# Patient Record
Sex: Female | Born: 1945 | Race: White | Hispanic: No
Health system: Southern US, Community
[De-identification: ages and names within clinical notes are randomized; demographics above are authoritative.]

---

## 2012-10-16 ENCOUNTER — Emergency Department: Payer: Self-pay | Admitting: Unknown Physician Specialty

## 2012-10-16 LAB — URINALYSIS, COMPLETE
Bacteria: NONE SEEN
Bilirubin,UR: NEGATIVE
Glucose,UR: NEGATIVE mg/dL (ref 0–75)
Leukocyte Esterase: NEGATIVE
Nitrite: NEGATIVE
Protein: NEGATIVE
RBC,UR: 4 /HPF (ref 0–5)
Specific Gravity: 1.031 (ref 1.003–1.030)
WBC UR: 1 /HPF (ref 0–5)

## 2012-10-16 LAB — CBC
HCT: 32.9 % — ABNORMAL LOW (ref 35.0–47.0)
HGB: 10.7 g/dL — ABNORMAL LOW (ref 12.0–16.0)
MCHC: 32.5 g/dL (ref 32.0–36.0)
RBC: 3.82 10*6/uL (ref 3.80–5.20)
WBC: 12.3 10*3/uL — ABNORMAL HIGH (ref 3.6–11.0)

## 2012-10-16 LAB — COMPREHENSIVE METABOLIC PANEL
Anion Gap: 13 (ref 7–16)
BUN: 10 mg/dL (ref 7–18)
Calcium, Total: 9 mg/dL (ref 8.5–10.1)
Chloride: 98 mmol/L (ref 98–107)
Co2: 24 mmol/L (ref 21–32)
EGFR (African American): >60
EGFR (Non-African Amer.): >60
Potassium: 2.7 mmol/L — ABNORMAL LOW (ref 3.5–5.1)
SGOT(AST): 37 U/L (ref 15–37)
SGPT (ALT): 19 U/L (ref 12–78)
Total Protein: 6.8 g/dL (ref 6.4–8.2)

## 2012-10-16 LAB — LIPASE, BLOOD: Lipase: 104 U/L (ref 73–393)

## 2012-10-19 LAB — URINE CULTURE

## 2012-10-21 DEATH — deceased

## 2012-10-22 LAB — CULTURE, BLOOD (SINGLE)

## 2014-03-13 IMAGING — CT CT ABD-PELV W/ CM
1 of 3 series · 13 of 32 positions shown, 18 images · non-contrast
Comparison: none

REASON FOR EXAM: (1) abd pain and diarrhea h/o metastatic CA eval; (2)
abd pain and diarrhea h/o
COMMENTS:

PROCEDURE:     CT  - CT ABDOMEN / PELVIS  W  - October 16, 2012  [DATE]
RESULT:     CT abdomen and pelvis dated 10/16/2012.
TECHNIQUE: Helical 3 mm sections were obtained from the lung bases through
the pubic symphysis status post intravenous administration of 85 mm
[id number].

[Series 2: 3mm soft tissue · axial · 0.75mm/px · z∈[-428,-41]mm · 13 of 147 slices shown, 18 images]
[im 9/147  soft-tissue]
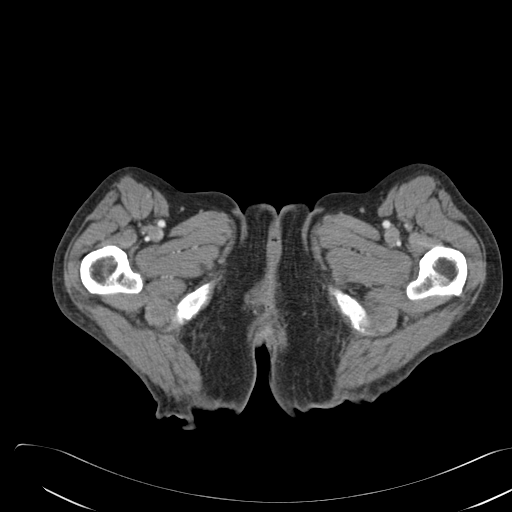
[im 9/147  bone]
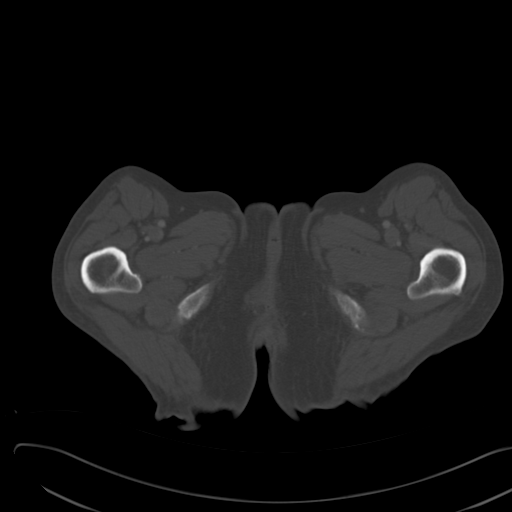
[im 26/147  soft-tissue]
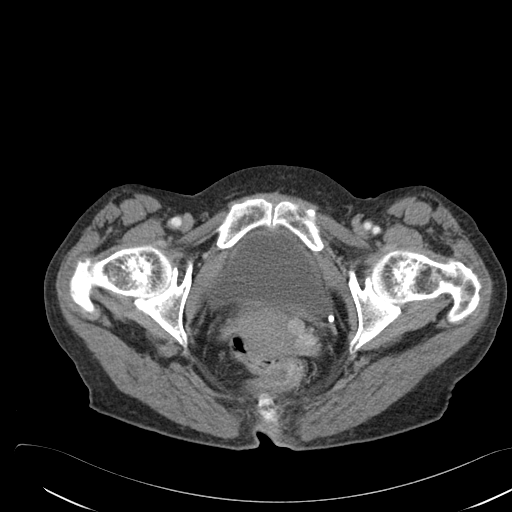
[im 35/147  soft-tissue]
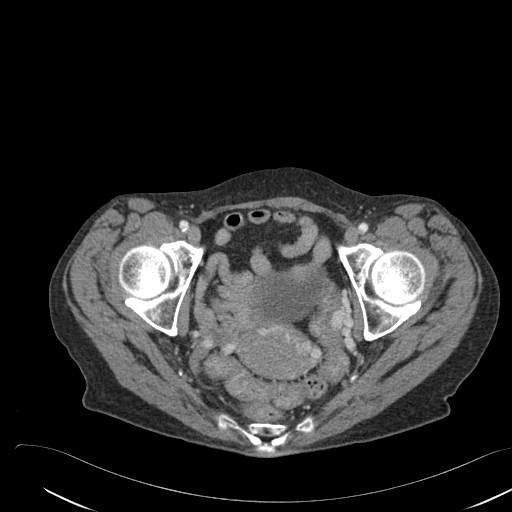
[im 43/147  soft-tissue]
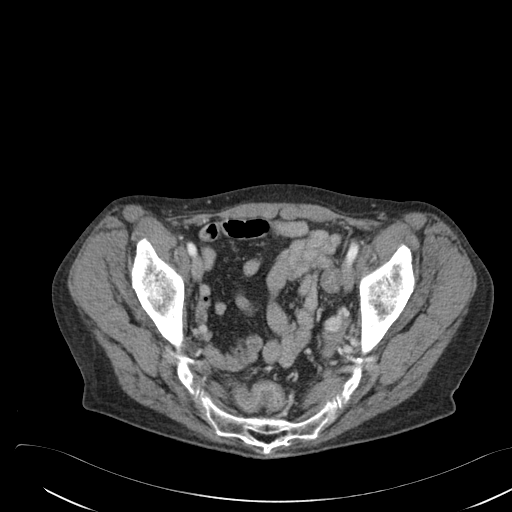
[im 61/147  soft-tissue]
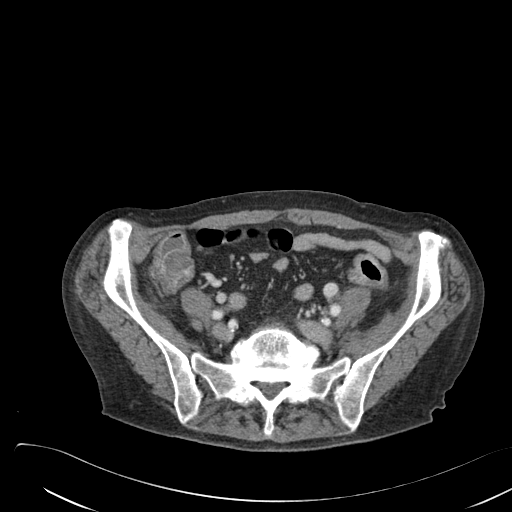
[im 69/147  soft-tissue]
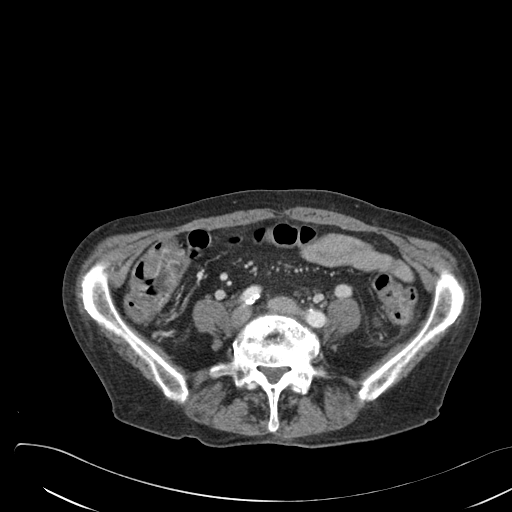
[im 78/147  soft-tissue]
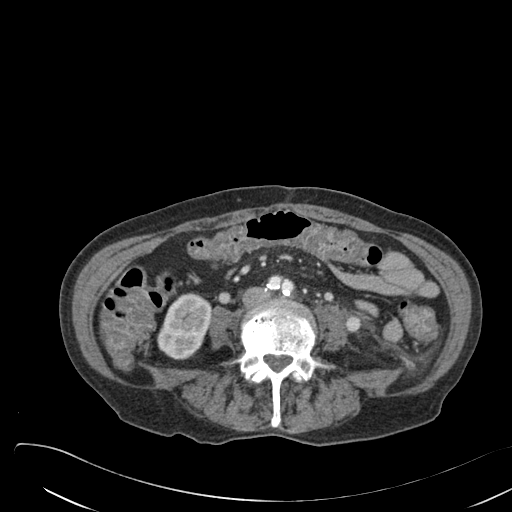
[im 95/147  soft-tissue]
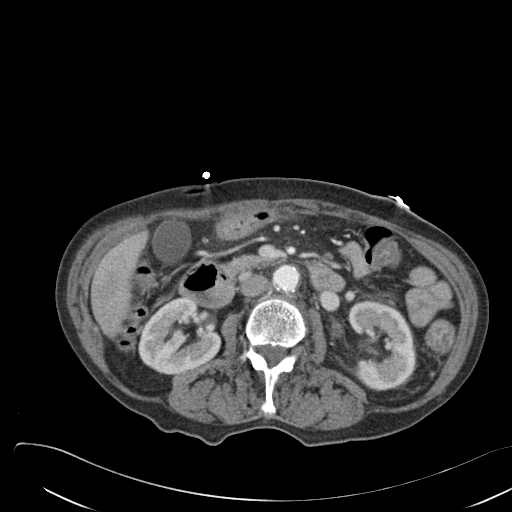
[im 104/147  soft-tissue]
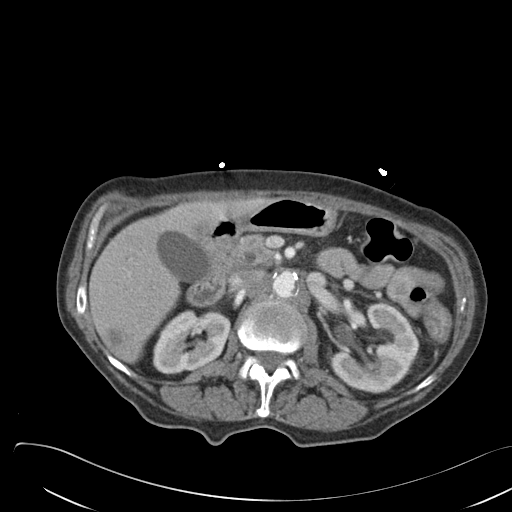
[im 104/147  bone]
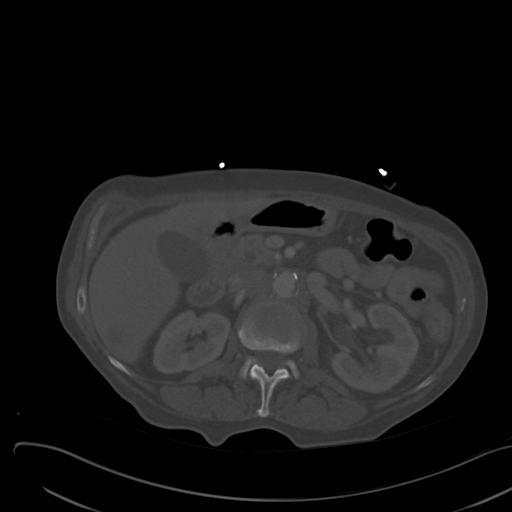
[im 112/147  soft-tissue]
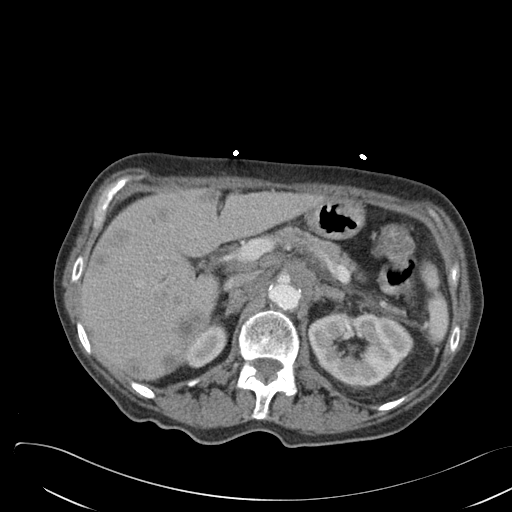
[im 112/147  lung]
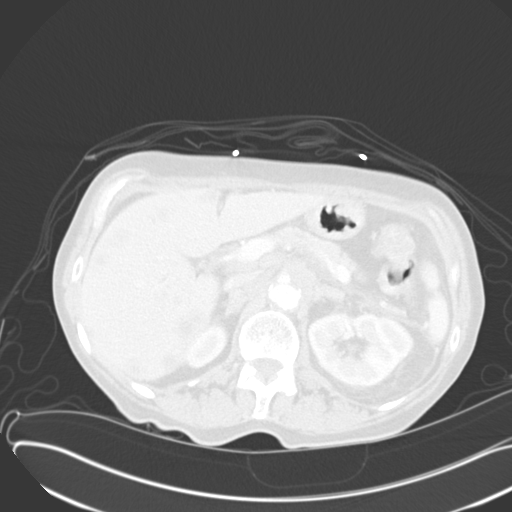
[im 121/147  lung]
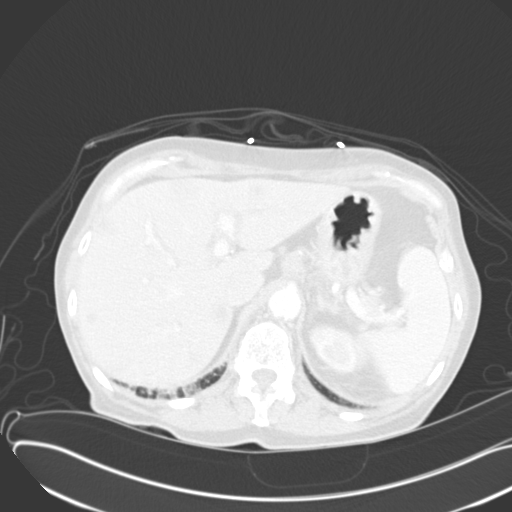
[im 129/147  soft-tissue]
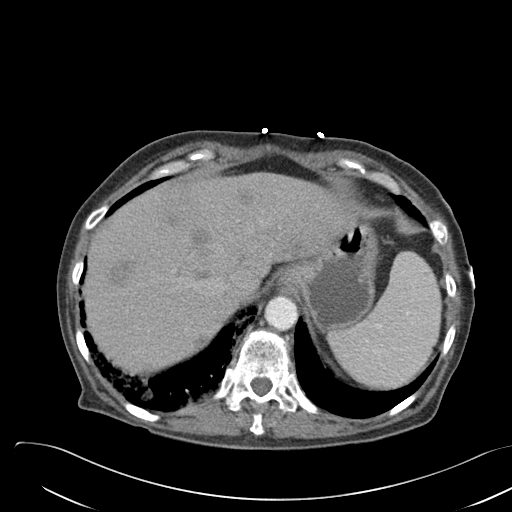
[im 129/147  lung]
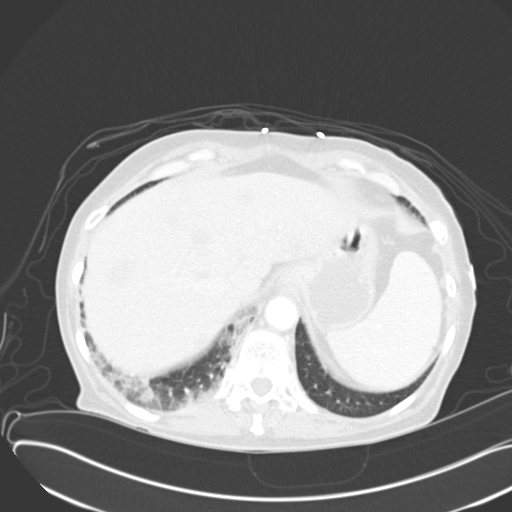
[im 138/147  soft-tissue]
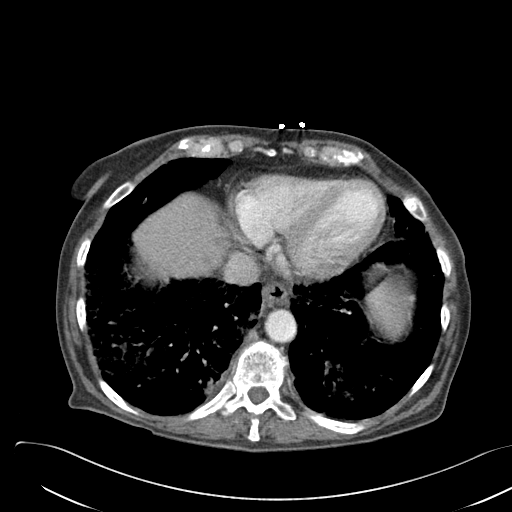
[im 138/147  lung]
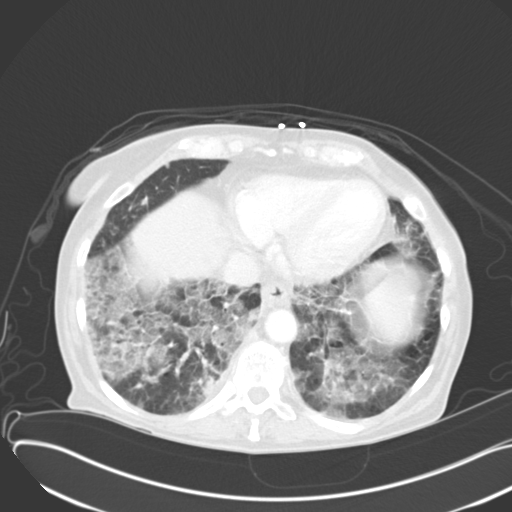

[13 of 32 positions shown; findings below may reference images not displayed]

FINDINGS: Multifocal areas of airspace disease project within the lung
bases. There is extension into the lingula region. These areas have
ill-defined consolidative nodular-like components.

Evaluation of the liver demonstrates multiple low attenuating nodules
scattered throughout the right and left lobes of the liver. These findings
are consistent with the patient's history of metastatic neoplastic disease.
Retroperitoneal adenopathy is appreciated within the celiac nodal chain
extending into the lymph nodes at the SMA level. The lymph nodes appear to
encase the inferior portion of the celiac and proximal portion of the SMA.
Adenopathy is also identified within the porta hepatis.

There is no evidence of abdominal aortic aneurysm. The celiac, SMA, IMA,
portal vein are opacified. There is prominence and extrahepatic biliary
ducts.

There is no CT evidence of bowel obstruction. There is diffuse bowel wall
thickening within the ascending colon and to lesser extent within the
transverse colon and descending colon. Component of the colonic findings are
secondary to non distention.

There is no evidence of abdominal pelvic loculated fluid collections dated
trace amount of free fluid is identified within the mesenteric fat as well
as prominence of the mesentery.

Adrenals, pancreas, spleen, kidneys are unremarkable.

Evaluation of the pelvis demonstrates prominent vessels in the left and
right adnexal regions raises concern of component of pelvic engorgement. No
definite pelvic masses are appreciated. No definite abdominal masses are
appreciated.
IMPRESSION: Diffuse the by basilar pulmonary infiltrates with
consolidative components. Different considerations are infectious etiologies
no metastatic disease considering the patient's history cannot be excluded.
2. Consistent with patient's reported history there is diffuse metastatic
disease within the liver.
3. Adenopathy within the porta hepatis and retroperitoneal regions as
described above consist of with metastatic disease.
4. Diffuse bowel wall thickening throughout the colon concerning for
colitis. A definitive focal mass is not appreciated.
5. Findings which may represent pelvic engorgement syndrome.

## 2015-04-09 NOTE — H&P (Signed)
PATIENT NAME:  Megan Pennington, Megan Pennington MR#:  [id number] DATE OF BIRTH:  1946/07/06  DATE OF ADMISSION:  10/16/2012  ED REFERRING PHYSICIAN: Dr. Enedina Finner   PRIMARY CARE PHYSICIAN: [facility].  ONCOLOGIST: [facility]     CHIEF COMPLAINT: Nausea, vomiting, diarrhea as well as shortness of breath.   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 The patient has been receiving care at [facility]. Her diagnosis initially was in August. She was told that her survival would be very short and her prognosis was very poor. She was arranged to have home Hospice which has been following the patient. The patient about since last Friday developed a lot of  nausea, vomiting, and diarrhea. She also was continuing to have epigastric pain, sharp in nature, which has been chronic. She also started becoming short of breath earlier today. She has no history of having shortness of breath. She denies any chest pains or palpitations. She denies any hematemesis or hematochezia.   PAST MEDICAL HISTORY:  1. History of fibromyalgia.  2. 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  They also state that she has a history of stomach cancer.  ALLERGIES:  No allergies.   CURRENT MEDICATIONS:  1. Bupropion 150, 1 tab p.o. b.i.d.  2. Gabapentin 300, 1 tab p.o. b.i.d.  3. Lorazepam 0.5 q. 4 p.r.n.  4. MiraLAX daily.  5. Omeprazole 20, 1 tab p.o. daily.  6. Oxycodone 10 mg, 1 tab p.o. t.i.d. p.r.n. for pain.  7. OxyContin 60, 1 tab 4 times per day.  8. Senna 1 tab p.o. b.i.d.  9. Venlafaxine 75 p.o. daily.  10. Zofran 4 mg, 1 tab p.o. b.i.d. p.r.n.   SOCIAL HISTORY: Denies any smoking, alcohol, or drug use currently.   FAMILY HISTORY: Positive for hypertension.   REVIEW OF SYSTEMS: CONSTITUTIONAL: Denies any  fevers. Complains of fatigue, weakness, abdominal pain, and weight loss. EYES: No blurred or double vision. No redness. No inflammation. No glaucoma. ENT: No tinnitus. No ear pain. No hearing loss. No seasonal or year-round allergies. No epistaxis. RESPIRATORY: Complains of nonproductive cough, also has been wheezing today. No hemoptysis. No history of tuberculosis or pneumonia. CARDIOVASCULAR: Denies any chest pain, orthopnea, edema, or arrhythmia. No syncope.  GI: Complains of nausea, vomiting, and diarrhea as above. Has chronic abdominal pain related to her cancer. Denies any hematemesis. No melena. GU: Denies any dysuria, hematuria, renal calculus, or frequency. ENDO: Denies any polyuria, nocturia, or thyroid problems. HEME/LYMPH: Denies easy bruisability or bleeding.  MUSCULOSKELETAL: Denies any pain in the neck, back, or shoulder. No gout. NEUROLOGIC: No numbness. No cerebrovascular accident. No transient ischemic attack. PSYCHIATRIC: Has history of depression.   PHYSICAL EXAMINATION:  VITAL SIGNS: Temperature 97.5, pulse 115, respirations 24, blood pressure 160/68, oxygen saturation is 91% on 5 liters of oxygen.   GENERAL: The patient is a thin-looking Caucasian female in obvious respiratory distress.   HEENT: Head atraumatic, normocephalic. Pupils equally round, reactive to light and accommodation. There is no conjunctival pallor. No scleral icterus. Nasal exam shows no drainage or ulceration.  Oropharynx is clear without any exudate.   NECK: No thyromegaly. No carotid bruits.   CARDIOVASCULAR: Tachycardic. No murmurs, rubs, clicks, or gallops. PMI is not displaced.   LUNGS: She has accessory muscle usage. Bilateral  wheezing. There are rhonchi.   ABDOMEN: Epigastric tenderness. There is no guarding. No rebound. Positive bowel sounds times four.  EXTREMITIES: No clubbing, cyanosis, or edema.   SKIN: No rash.   LYMPHATICS: No lymph nodes palpable.   VASCULAR: Good DP, PT pulses.    PSYCHIATRIC: Does not appear anxious or depressed currently.   NEUROLOGICAL: Awake, alert, oriented times three. No focal deficits.   EVALUATIONS IN THE ED: ABG pH of 7.49, pCO2 27, pO2 55, bicarbonate 20.6. Urinalysis shows nitrites negative, leukocytes negative. CT scan of the abdomen and pelvis with contrast shows diffuse basilar pulmonary infiltrate with consolidative component. Differentials are infectious, metastatic disease. Also, diffuse metastatic disease in the liver, also adenopathy in the porta hepatitis and retroperitoneal region consistent with metastatic disease. Diffuse bowel wall thickening throughout the colon concerning for colitis. A definitive focal mass is not appreciated. WBC count 12.3, hemoglobin 10.7, platelet count 417, glucose 102, BUN 10, creatinine 0.61, sodium 135, potassium 2.7, chloride is 98, CO2 24. LFTs: Alkaline phosphatase 169, albumin of 2.3, magnesium 1.6, lipase 104.   ASSESSMENT AND PLAN: The patient is a 69 year old Caucasian female with diffuse metastatic cancer apparently from the small intestine who presents with nausea, vomiting, diarrhea, and also has acute respiratory failure.  1. 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 There is a very high likelihood that she has progression of her disease acutely. The patient understands this. She already has a DO NOT RESUSCITATE in place and was already being followed by Hospice at home.  2. Acute respiratory failure: Possibly due to worsening metastatic pulmonary disease, also could be due to possible pneumonia. The patient is not interested in staying in the hospital due to her very poor prognosis. She is aware that there is a very good chance that this may lead to her death. At this time we are attempting to arrange home oxygen via Hospice. If this is achieved then she will be discharged home.  3. Possible colitis: If the patient is still in the hospital we will check Clostridium difficile and  send stool cultures.  4. Disposition: At this time arrangements are being made for possible oxygen for home. Otherwise she will be admitted for observation overnight so we can arrange oxygen for home and comfort care.   Please note if the patient is discharged this will be an outpatient consult summary.  TIME SPENT: 45 minutes.  ____________________________ Lacie ScottsShreyang H. Allena KatzPatel, MD [id number] D: 10/16/2012 15:04:03 ET T: 10/16/2012 15:29:05 ET JOB#: [id number]  cc: Steve Youngberg H. Allena KatzPatel, MD, <Dictator> Charise CarwinSHREYANG H Correen Bubolz MD ELECTRONICALLY SIGNED 10/21/2012 17:33
